# Patient Record
Sex: Male | Born: 1985 | Race: White | Hispanic: No | Marital: Single | Smoking: Former smoker
Health system: Southern US, Community
[De-identification: ages and names within clinical notes are randomized; demographics above are authoritative.]

## PROBLEM LIST (undated history)

## (undated) DIAGNOSIS — J45909 Unspecified asthma, uncomplicated: Secondary | ICD-10-CM

## (undated) DIAGNOSIS — T7840XA Allergy, unspecified, initial encounter: Secondary | ICD-10-CM

## (undated) HISTORY — DX: Allergy, unspecified, initial encounter: T78.40XA

## (undated) HISTORY — DX: Unspecified asthma, uncomplicated: J45.909

---

## 2014-09-13 ENCOUNTER — Ambulatory Visit (INDEPENDENT_AMBULATORY_CARE_PROVIDER_SITE_OTHER): Payer: BC Managed Care – PPO | Admitting: Family Medicine

## 2014-09-13 ENCOUNTER — Ambulatory Visit (INDEPENDENT_AMBULATORY_CARE_PROVIDER_SITE_OTHER): Payer: BC Managed Care – PPO

## 2014-09-13 VITALS — BP 152/92 | HR 82 | Temp 97.9°F | Resp 16 | Ht 73.0 in | Wt 213.0 lb

## 2014-09-13 DIAGNOSIS — S92352A Displaced fracture of fifth metatarsal bone, left foot, initial encounter for closed fracture: Secondary | ICD-10-CM

## 2014-09-13 DIAGNOSIS — S99922A Unspecified injury of left foot, initial encounter: Secondary | ICD-10-CM

## 2014-09-13 MED ORDER — IBUPROFEN 800 MG PO TABS: 800.0000 mg | ORAL_TABLET | Freq: Three times a day (TID) | ORAL | Status: AC | PRN

## 2014-09-13 MED ORDER — HYDROCODONE-ACETAMINOPHEN 5-325 MG PO TABS: 1.0000 | ORAL_TABLET | Freq: Four times a day (QID) | ORAL | Status: DC | PRN

## 2014-09-13 NOTE — Progress Notes (Signed)
Subjective:    Patient ID: Matthew Oneill, male    DOB: 01/30/1986, 28 y.o.   MRN: [id number] Chief Complaint  Patient presents with  . Foot Injury    injured left foot last night   . Foot Pain    HPI  Yesterday was playing soccur and had inversion injury landed on her left lateral foot - heard it crunch. Was able to walk immed after and did finish game but after game pain and swelling progressively worsened. He iced it overnight but this morning sxs have continue to worsen with increased swelling and pain. No medications or prior injuries to foot.  Past Medical History  Diagnosis Date  . Allergy   . Asthma    No current outpatient prescriptions on file prior to visit.   No current facility-administered medications on file prior to visit.   No Known Allergies   Review of Systems  Constitutional: Negative for fever, chills, diaphoresis, activity change and appetite change.  Cardiovascular: Positive for leg swelling.  Musculoskeletal: Positive for arthralgias, gait problem and joint swelling.  Skin: Positive for color change. Negative for rash and wound.  Neurological: Negative for weakness and numbness.  Hematological: Negative for adenopathy. Does not bruise/bleed easily.       Objective:  BP 152/92  Pulse 82  Temp(Src) 97.9 F (36.6 C) (Oral)  Resp 16  Ht 6\' 1"  (1.854 m)  Wt 213 lb (96.616 kg)  BMI 28.11 kg/m2  SpO2 98%  Physical Exam  Constitutional: He is oriented to person, place, and time. He appears well-developed and well-nourished. No distress.  HENT:  Head: Normocephalic and atraumatic.  Eyes: No scleral icterus.  Pulmonary/Chest: Effort normal.  Musculoskeletal:       Left ankle: Normal. He exhibits normal range of motion, no swelling, no ecchymosis and normal pulse.       Left foot: He exhibits decreased range of motion, tenderness, bony tenderness and swelling. He exhibits normal capillary refill, no crepitus, no deformity and no laceration.  Left  foot severely swollen, most on lateral aspect, mild erythema. Metatarsals ttp proximally 2-5th on plantar and dorsal aspect  Neurological: He is alert and oriented to person, place, and time.  Skin: Skin is warm and dry. He is not diaphoretic.  Psychiatric: He has a normal mood and affect. His behavior is normal.      [facility] reading (PRIMARY) by  Dr. Clelia CroftShaw.  Left foot xray: non-displaced styloid avulsion fracture of proximal 5th metatarsal.    EXAM: LEFT FOOT - COMPLETE 3+ VIEW  COMPARISON: None.  FINDINGS: The patient has a nondisplaced fracture of the proximal fifth metatarsal with associated soft tissue swelling. No other bony or joint abnormality is identified.  IMPRESSION: Nondisplaced fracture base of the fifth metatarsal.  Assessment & Plan:  CHARGE FRACTURE CODE SO ALL F/U IS W/ ME RECHECK IN 1 WK. Place in [facility] with ankle at 90 degrees for no longer than 2 wks. Rehceck in 1 wk to see if can transition to firm-sole shoe.  Recheck every 2-4 wks and repeat xray in 6-8 wks to document healing. Advance activity as tolerated. Foot injury, left, initial encounter - Plan: DG Foot Complete Left  Fracture of fifth metatarsal bone of left foot, closed, initial encounter  Meds ordered this encounter  Medications  . HYDROcodone-acetaminophen (NORCO/VICODIN) 5-325 MG per tablet    Sig: Take 1 tablet by mouth every 6 (six) hours as needed for moderate pain.    Dispense:  30 tablet  Refill:  0  . ibuprofen (ADVIL,MOTRIN) 800 MG tablet    Sig: Take 1 tablet (800 mg total) by mouth every 8 (eight) hours as needed.    Dispense:  60 tablet    Refill:  0     Norberto Sorenson, MD MPH

## 2014-09-13 NOTE — Patient Instructions (Signed)
You can bear weight as tolerated. Recheck in 1 wk to see if you can transition to a firm sold shoe (clog or hiking boot - bring with you.)  This will likely heal in 1-2 months, you should probably come in to be rechecked every 2 wks and before you go back to normal shoes we will repeat an xray to ensure it has healed.  Please call ahead to make sure I am in clinic - if I see you in follow-up for this, you should NOT need to pay another co-pay nor wait as long to be seen. Metatarsal Fracture  with Rehab A metatarsal fracture is a break (fracture) of one of the bones of the mid-foot (metatarsal bones). The metatarsal bones are responsible for maintaining the arch of the foot. There are three classifications of metatarsal fractures: dancer's fractures, Jones fractures, and stress fractures. A dancer's fracture is when a piece of bone is pulled off by a ligament or tendon (avulsion fracture) of the outer part of the foot (fifth metatarsal), near the joint with the ankle bones. A Jones fracture occurs in the middle of the fifth metatarsal. These fractures have limited ability to heal. A stress fracture occurs when the bone is slowly injured faster than it can repair itself. SYMPTOMS   Sharp pain, especially with standing or walking.  Tenderness, swelling, and later bruising (contusion) of the foot.  Numbness or paralysis from swelling in the foot, causing pressure on the blood vessels or nerves (uncommon). CAUSES  Fractures occur when a force is placed on the bone that is greater than it can handle. Common causes of injury include:  Direct hit (trauma) to the foot.  Twisting injury to the foot or ankle.  Landing on the foot and ankle in an improper position. RISK INCRESES WITH:  Participation in contact sports, sports that require jumping and landing, or sports in which cleats are worn and sliding occurs.  Previous foot or ankle sprains or dislocations.  Repeated injury to any joint in the  foot.  Poor strength and flexibility. PREVENTION  Warm up and stretch properly before an activity.  Allow for adequate recovery between workouts.  Maintain physical fitness in:  Strength, flexibility, and endurance.  Cardiovascular fitness.  When participating in jumping or contact sports, protect joints with supportive devices, such as wrapped elastic bandages, tape, braces, or high-top athletic shoes.  Wear properly fitted and padded protective equipment. PROGNOSIS If treated properly, metatarsal fractures usually heal well. Jones fractures have a higher risk of the bone failing to heal (nonunion). Sometimes, surgery is needed to heal Jones fractures.  RELATED COMPLICATIONS   Nonunion.  Fracture heals in a poor position (malunion).  Long-term (chronic) pain, stiffness, or swelling of the foot.  Excessive bleeding in the foot or at the dislocation site, causing pressure and injury to nerves and blood vessels (rare).  Unstable or arthritic joint following repeated injury or delayed treatment. TREATMENT  Treatment first involves the use of ice and medicine, to reduce pain and inflammation. If the bone fragments are out of alignment (displaced), then immediate realigning of the bones (reduction) is required. Fractures that cannot be realigned by hand, or where the bones protrude through the skin (open), may require surgery to hold the fracture in place with screws, pins, and plates. After the bones are in proper alignment, the foot and ankle must be restrained for 6 or more weeks. Restraint allows healing to occur. After restraint, it is important to perform strengthening and stretching exercises  to help regain strength and a full range of motion. These exercises may be completed at home or with a therapist. A stiff-soled shoe and arch support (orthotic) may be required when first returning to sports. MEDICATION   If pain medicine is needed, nonsteroidal anti-inflammatory medicines  (NSAIDS), or other minor pain relievers, are often advised.  Do not take pain medicine for 7 days before surgery.  Only take over-the-counter or prescription medicines for pain, fever, or discomfort as directed by your caregiver. COLD THERAPY  Cold treatment (icing) should be applied for 10 to 15 minutes every 2 to 3 hours for inflammations and pain, and immediately after activity that aggravates your symptoms. Use ice packs or ice massage. SEEK MEDICAL CARE IF:  Pain, tenderness, or swelling gets worse, despite treatment.  You experience pain, numbness, or coldness in the foot.  Blue, gray, or dark color appears in the toenails.  You or your child has an oral temperature above 102 F (38.9 C).  You have increased pain, swelling, and redness.  You have drainage of fluids or bleeding in the affected area.  New, unexplained symptoms develop. (Drugs used in treatment may produce side effects.) EXERCISES RANGE OF MOTION (ROM) AND STRETCHING EXERCISES - Metatarsal Fracture (including Jones and Dancer's Fractures) These exercises may help you when beginning to rehabilitate your injury. Your symptoms may resolve with or without further involvement from your physician, physical therapist, or athletic trainer. While completing these exercises, remember:   Restoring tissue flexibility helps normal motion to return to the joints. This allows healthier, less painful movement and activity.  An effective stretch should be held for at least 30 seconds. A stretch should never be painful. You should only feel a gentle lengthening or release in the stretched. RANGE OF MOTION - Dorsi/Plantar Flexion  While sitting with your right / left knee straight, draw the top of your foot upwards, by flexing your ankle. Then reverse the motion, pointing your toes downward.  Hold each position for __________ seconds.  After completing your first set of exercises, repeat this exercise with your knee  bent. Repeat __________ times. Complete this exercise __________ times per day.  RANGE OF MOTION - Ankle Alphabet  Imagine your right / left big toe is a pen.  Keeping your hip and knee still, write out the entire alphabet with your "pen." Make the letters as large as you can, without increasing any discomfort. Repeat __________ times. Complete this exercise __________ times per day.  STRETCH - Gastroc, Standing  Place your hands on a wall.  Extend your right / left leg behind you, keeping the front knee somewhat bent.  Slightly point your toes inward on your back foot.  Keeping your right / left heel on the floor and your knee straight, shift your weight toward the wall, not allowing your back to arch.  You should feel a gentle stretch in the right / left calf. Hold this position for __________ seconds. Repeat __________ times. Complete this stretch __________ times per day. STRETCH - Soleus, Standing   Place your hands on a wall.  Extend your right / left leg behind you, keeping the other knee somewhat bent.  Slightly point your toes inward on your back foot.  Keep your right / left heel on the floor, bend your back knee, and slightly shift your weight over the back leg so that you feel a gentle stretch deep in your back calf.  Hold this position for __________ seconds. Repeat __________ times. Complete  this stretch __________ times per day. STRENGTHENING EXERCISES - Metatarsal Fracture (Including Jones and Dancer's Fractures) These exercises may help you when beginning to rehabilitate your injury. They may resolve your symptoms with or without further involvement from your physician, physical therapist, or athletic trainer. While completing these exercises, remember:   Muscles can gain both the endurance and the strength needed for everyday activities through controlled exercises.  Complete these exercises as instructed by your physician, physical therapist or athletic  trainer. Increase the resistance and repetitions only as guided by your caregiver. STRENGTH - Dorsiflexors  Secure a rubber exercise band or tubing to a fixed object (table, pole) and loop the other end around your right / left foot.  Sit on the floor facing the fixed object. The band should be slightly tense when your foot is relaxed.  Slowly draw your foot back toward you, using your ankle and toes.  Hold this position for __________ seconds. Slowly release the tension in the band and return your foot to the starting position. Repeat __________ times. Complete this exercise __________ times per day.  STRENGTH - Plantar-flexors   Sit with your right / left leg extended. Holding onto both ends of a rubber exercise band or tubing, loop it around the ball of your foot. Keep a slight tension in the band.  Slowly push your toes away from you, pointing them downward.  Hold this position for __________ seconds. Return slowly, controlling the tension in the band. Repeat __________ times. Complete this exercise __________ times per day.  STRENGTH - Plantar-flexors  Stand with your feet shoulder width apart. Steady yourself with a wall or table, using as little support as needed.  Keeping your weight evenly spread over the width of your feet, rise up on your toes.*  Hold this position for __________ seconds. Repeat __________ times. Complete this exercise __________ times per day.  *If this is too easy, shift your weight toward your right / left leg until you feel challenged. Ultimately, you may be asked to do this exercise while standing on your right / left foot only. STRENGTH - Towel Curls  Sit in a chair, on a non-carpeted surface.  Place your foot on a towel, keeping your heel on the floor.  Pull the towel toward your heel only by curling your toes. Keep your heel on the floor.  If instructed by your physician, physical therapist, or athletic trainer, weight may be added at the end of  the towel. Repeat __________ times. Complete this exercise __________ times per day. STRENGTH - Ankle Eversion  Secure one end of a rubber exercise band or tubing to a fixed object (table, pole). Loop the other end around your foot, just before your toes.  Place your fists between your knees. This will focus your strengthening at your ankle.  Drawing the band across your opposite foot, away from the pole, slowly, pull your little toe out and up. Make sure the band is positioned to resist the entire motion.  Hold this position for __________ seconds.  Have your muscles resist the band, as it slowly pulls your foot back to the starting position. Repeat __________ times. Complete this exercise __________ times per day.  STRENGTH - Ankle Inversion  Secure one end of a rubber exercise band or tubing to a fixed object (table, pole). Loop the other end around your foot, just before your toes.  Place your fists between your knees. This will focus your strengthening at your ankle.  Slowly, pull your  big toe up and in, making sure the band is positioned to resist the entire motion.  Hold this position for __________ seconds.  Have your muscles resist the band, as it slowly pulls your foot back to the starting position. Repeat __________ times. Complete this exercises __________ times per day.  Document Released: 11/30/2005 Document Revised: 02/22/2012 Document Reviewed: 03/15/2014 [facility] Patient Information 2015 [place], [place]. This information is not intended to replace advice given to you by your health care provider. Make sure you discuss any questions you have with your health care provider.

## 2014-09-21 ENCOUNTER — Telehealth: Payer: Self-pay | Admitting: Radiology

## 2014-09-21 ENCOUNTER — Ambulatory Visit (INDEPENDENT_AMBULATORY_CARE_PROVIDER_SITE_OTHER): Payer: BC Managed Care – PPO | Admitting: Family Medicine

## 2014-09-21 VITALS — BP 118/70 | HR 60 | Temp 98.3°F | Resp 16 | Ht 73.75 in | Wt 223.0 lb

## 2014-09-21 DIAGNOSIS — Z4789 Encounter for other orthopedic aftercare: Secondary | ICD-10-CM

## 2014-09-21 DIAGNOSIS — IMO0002 Reserved for concepts with insufficient information to code with codable children: Secondary | ICD-10-CM

## 2014-09-21 MED ORDER — HYDROCODONE-ACETAMINOPHEN 5-325 MG PO TABS: 1.0000 | ORAL_TABLET | Freq: Four times a day (QID) | ORAL | Status: AC | PRN

## 2014-09-21 NOTE — Telephone Encounter (Signed)
Left message for pt to advise can work him in for appt  Next week on Friday at 9:15

## 2014-09-28 ENCOUNTER — Ambulatory Visit (INDEPENDENT_AMBULATORY_CARE_PROVIDER_SITE_OTHER): Payer: BC Managed Care – PPO | Admitting: Family Medicine

## 2014-09-28 ENCOUNTER — Encounter: Payer: Self-pay | Admitting: Family Medicine

## 2014-09-28 VITALS — BP 120/70 | HR 86 | Temp 98.3°F | Resp 16 | Ht 73.0 in | Wt 217.2 lb

## 2014-09-28 DIAGNOSIS — Z4789 Encounter for other orthopedic aftercare: Secondary | ICD-10-CM

## 2014-09-28 DIAGNOSIS — IMO0002 Reserved for concepts with insufficient information to code with codable children: Secondary | ICD-10-CM

## 2014-09-28 NOTE — Progress Notes (Deleted)
° °  Subjective:    Patient ID: Matthew Oneill, male    DOB: 03/31/1986, 28 y.o.   MRN: [id number]  This chart was scribed for Sherren MochaEva N Shaw, MD by Annye AsaAnna Dorsett, ED Scribe. This patient was seen in room 27 and the patient's care was started at 10:06 AM.   Chief Complaint  Patient presents with   Follow-up    left foot fracture    HPI  HPI Comments: Matthew Oneill is a 28 y.o. male who presents to the [facility] for a two week follow up on a left previous proximal fifth metatarsal styloid avulsion fracture. He reports that he is doing much better; his pain is only noticeable upon waking in the mornings.   Past Medical History  Diagnosis Date   Allergy    Asthma    Current Outpatient Prescriptions on File Prior to Visit  Medication Sig Dispense Refill   HYDROcodone-acetaminophen (NORCO/VICODIN) 5-325 MG per tablet Take 1 tablet by mouth every 6 (six) hours as needed for moderate pain.  30 tablet  0   ibuprofen (ADVIL,MOTRIN) 800 MG tablet Take 1 tablet (800 mg total) by mouth every 8 (eight) hours as needed.  60 tablet  0   No current facility-administered medications on file prior to visit.   No Known Allergies  Review of Systems  Constitutional: Negative for fever and chills.  Respiratory: Negative for shortness of breath.   Gastrointestinal: Negative for nausea, vomiting, abdominal pain and diarrhea.  Genitourinary: Negative for dysuria, urgency, frequency and hematuria.  Skin: Negative for rash.    BP 120/70   Pulse 86   Temp(Src) 98.3 F (36.8 C) (Oral)   Resp 16   Ht 6\' 1"  (1.854 m)   Wt 217 lb 3.2 oz (98.521 kg)   BMI 28.66 kg/m2   SpO2 96%     Objective:   Physical Exam  Nursing note and vitals reviewed. Constitutional: He is oriented to person, place, and time. He appears well-developed and well-nourished.  HENT:  Head: Normocephalic and atraumatic.  Neck: No tracheal deviation present.  Cardiovascular: Normal rate.   Pulmonary/Chest:  Effort normal.  Musculoskeletal:  Point tenderness over anterior aspect of dorsal proximal fifth metatarsal; none over plantar or lateral aspect  Neurological: He is alert and oriented to person, place, and time.  Skin: Skin is warm and dry.  Psychiatric: He has a normal mood and affect. His behavior is normal.        Assessment & Plan:  Follow-up fracture care for healing fracture Healing well - ok to convirt to firm-soled hiking boot.  Recheck in 2 wks.  I personally performed the services described in this documentation, which was scribed in my presence. The recorded information has been reviewed and considered, and addended by me as needed.  Norberto SorensonEva Shaw, MD MPH

## 2014-10-11 NOTE — Progress Notes (Signed)
   Subjective:    Patient ID: Daphene CalamityRyan Mendel, male    DOB: 01/08/1986, 28 y.o.   MRN: [id number] This chart was scribed for Norberto SorensonEva Shaw, MD by Jolene Provostobert Halas, Medical Scribe. This patient was seen in Room 11 and the patient's care was started a 11:12 AM.  Chief Complaint  Patient presents with  . Follow-up    L foot    HPI  Past Medical History  Diagnosis Date  . Allergy   . Asthma    Current Outpatient Prescriptions on File Prior to Visit  Medication Sig Dispense Refill  . ibuprofen (ADVIL,MOTRIN) 800 MG tablet Take 1 tablet (800 mg total) by mouth every 8 (eight) hours as needed.  60 tablet  0   No current facility-administered medications on file prior to visit.   No Known Allergies  HPI Comments: Daphene CalamityRyan Polyak is a 28 y.o. male who presents to [facility] for a follow up for a left foot injury that happened during a soccer game ten days ago. [facility] Dr. Clelia CroftShaw previously. Pt states pain and swelling are improving. Pt states he is still walking with a boot.  Review of Systems  Constitutional: Negative for fever, chills, diaphoresis, activity change and appetite change.  Musculoskeletal: Positive for arthralgias, gait problem (Left ankle/foot injury.) and joint swelling. Negative for back pain and myalgias.  Skin: Negative for color change, rash and wound.  Neurological: Negative for weakness and numbness.  Hematological: Negative for adenopathy. Does not bruise/bleed easily.       Objective:  BP 118/70  Pulse 60  Temp(Src) 98.3 F (36.8 C) (Oral)  Resp 16  Ht 6' 1.75" (1.873 m)  Wt 223 lb (101.152 kg)  BMI 28.83 kg/m2  SpO2 98%  Physical Exam  Nursing note and vitals reviewed. Constitutional: He is oriented to person, place, and time. He appears well-developed and well-nourished.  HENT:  Head: Normocephalic and atraumatic.  Eyes: Pupils are equal, round, and reactive to light.  Cardiovascular:  Pulses:      Dorsalis pedis pulses are 2+ on the right  side, and 2+ on the left side.  Pulmonary/Chest:  System not reviewed.  Abdominal:  System not reviewed.  Musculoskeletal:       Right shoulder: He exhibits tenderness, swelling and pain. He exhibits normal range of motion.  Pt has mildly reduced dorsiflexion, good plantar flection. Most tenderness focussed over mid-lateral medi tarsals.   Neurological: He is alert and oriented to person, place, and time.  Skin: Skin is warm and dry.  Psychiatric: He has a normal mood and affect. His behavior is normal.          Assessment & Plan:   Follow-up fracture care for healing fracture Healing well. Cont CAM boot, recheck in 1 wk - will hopefully be able to transition to firm-soled shoe or hiking boot at f/u for this proximal 5th metatarsal styloid avulsion fracture 1 wk prev. Meds ordered this encounter  Medications  . HYDROcodone-acetaminophen (NORCO/VICODIN) 5-325 MG per tablet    Sig: Take 1 tablet by mouth every 6 (six) hours as needed for moderate pain.    Dispense:  30 tablet    Refill:  0    I personally performed the services described in this documentation, which was scribed in my presence. The recorded information has been reviewed and considered, and addended by me as needed.  Norberto SorensonEva Shaw, MD MPH

## 2014-10-11 NOTE — Progress Notes (Signed)
   Subjective:  ° ° Patient ID: Matthew Oneill, male    DOB: 07/31/1986, 28 y.o.   MRN: [id number] ° °This chart was scribed for Eva N Shaw, MD by Anna Dorsett, ED Scribe. This patient was seen in room 27 and the patient's care was started at 10:06 AM.  ° °Chief Complaint  °Patient presents with  °• Follow-up  °  left foot fracture  ° ° °HPI ° °HPI Comments: °Matthew Oneill is a 28 y.o. male who presents to the [facility] for a two week follow up on a left previous proximal fifth metatarsal styloid avulsion fracture. He reports that he is doing much better; his pain is only noticeable upon waking in the mornings.  ° °Past Medical History  °Diagnosis Date  °• Allergy   °• Asthma   ° °Current Outpatient Prescriptions on File Prior to Visit  °Medication Sig Dispense Refill  °• HYDROcodone-acetaminophen (NORCO/VICODIN) 5-325 MG per tablet Take 1 tablet by mouth every 6 (six) hours as needed for moderate pain.  30 tablet  0  °• ibuprofen (ADVIL,MOTRIN) 800 MG tablet Take 1 tablet (800 mg total) by mouth every 8 (eight) hours as needed.  60 tablet  0  ° °No current facility-administered medications on file prior to visit.  ° °No Known Allergies ° °Review of Systems  °Constitutional: Negative for fever and chills.  °Respiratory: Negative for shortness of breath.   °Gastrointestinal: Negative for nausea, vomiting, abdominal pain and diarrhea.  °Genitourinary: Negative for dysuria, urgency, frequency and hematuria.  °Skin: Negative for rash.  ° ° °BP 120/70   Pulse 86   Temp(Src) 98.3 °F (36.8 °C) (Oral)   Resp 16   Ht 6' 1" (1.854 m)   Wt 217 lb 3.2 oz (98.521 kg)   BMI 28.66 kg/m2   SpO2 96% ° °   °Objective:  ° Physical Exam  °Nursing note and vitals reviewed. °Constitutional: He is oriented to person, place, and time. He appears well-developed and well-nourished.  °HENT:  °Head: Normocephalic and atraumatic.  °Neck: No tracheal deviation present.  °Cardiovascular: Normal rate.   °Pulmonary/Chest:  Effort normal.  °Musculoskeletal:  °Point tenderness over anterior aspect of dorsal proximal fifth metatarsal; none over plantar or lateral aspect  °Neurological: He is alert and oriented to person, place, and time.  °Skin: Skin is warm and dry.  °Psychiatric: He has a normal mood and affect. His behavior is normal.  ° ° ° °   °Assessment & Plan:  °Follow-up fracture care for healing fracture °Healing well - ok to convirt to firm-soled hiking boot.  Recheck in 2 wks. ° °I personally performed the services described in this documentation, which was scribed in my presence. The recorded information has been reviewed and considered, and addended by me as needed.  °Eva Shaw, MD MPH ° ° ° °

## 2014-10-12 ENCOUNTER — Encounter: Payer: BC Managed Care – PPO | Admitting: Family Medicine

## 2014-10-12 NOTE — Progress Notes (Signed)
This encounter was created in error - please disregard.

## 2016-05-08 IMAGING — CR DG FOOT COMPLETE 3+V*L*
3 series · 3 of 3 positions shown · non-contrast
Comparison: None.

CLINICAL DATA: Left foot pain after an injury playing soccer.

EXAM:
LEFT FOOT - COMPLETE 3+ VIEW

[AP]
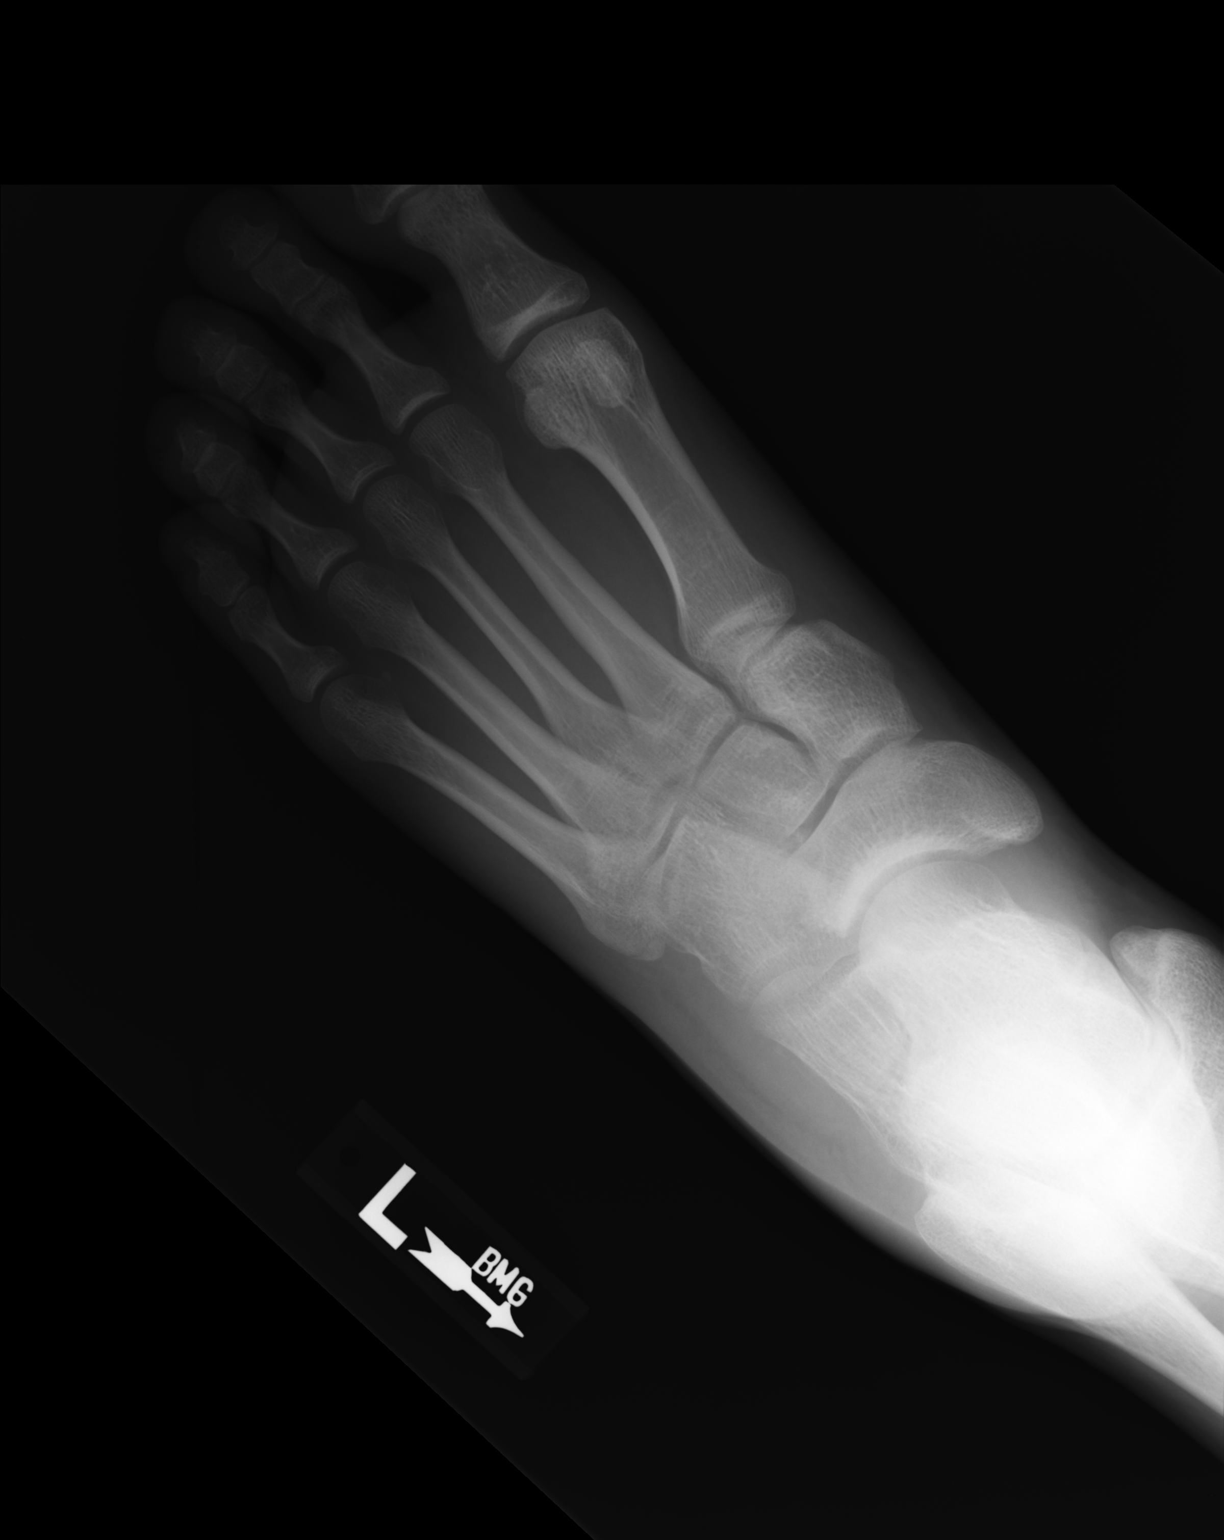

[ap obl int rot]
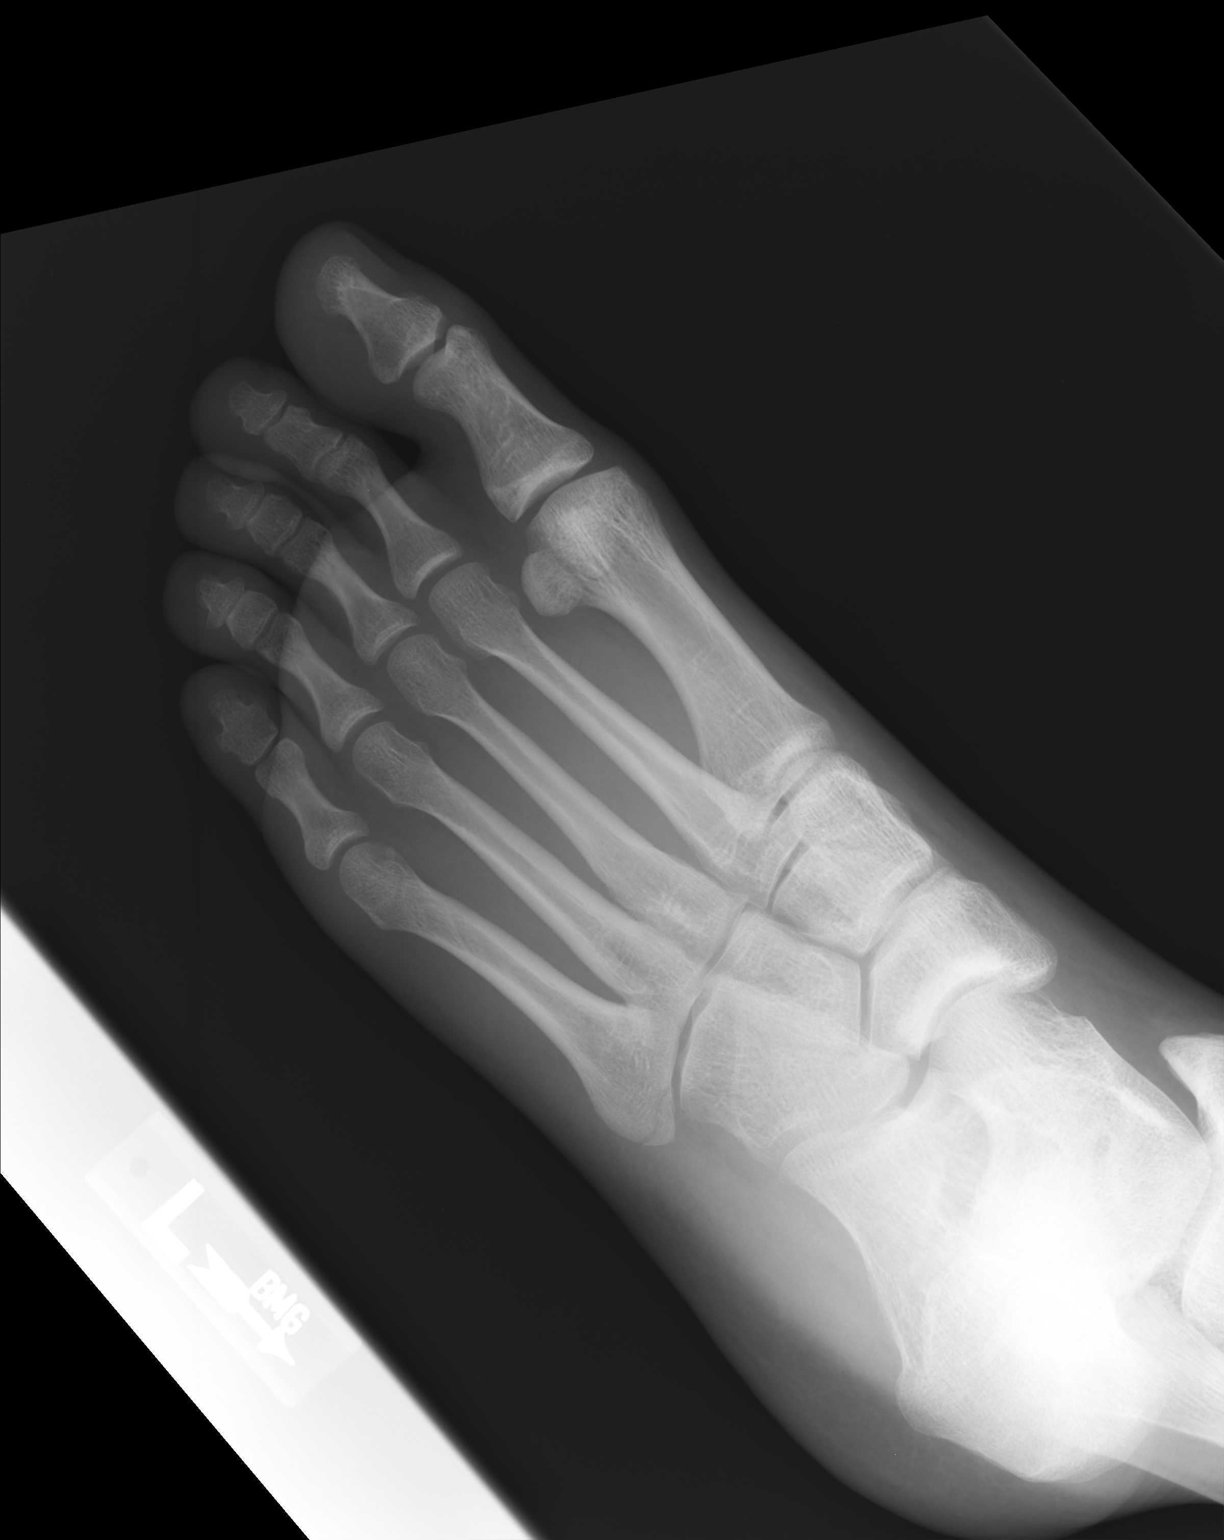

[lateral]
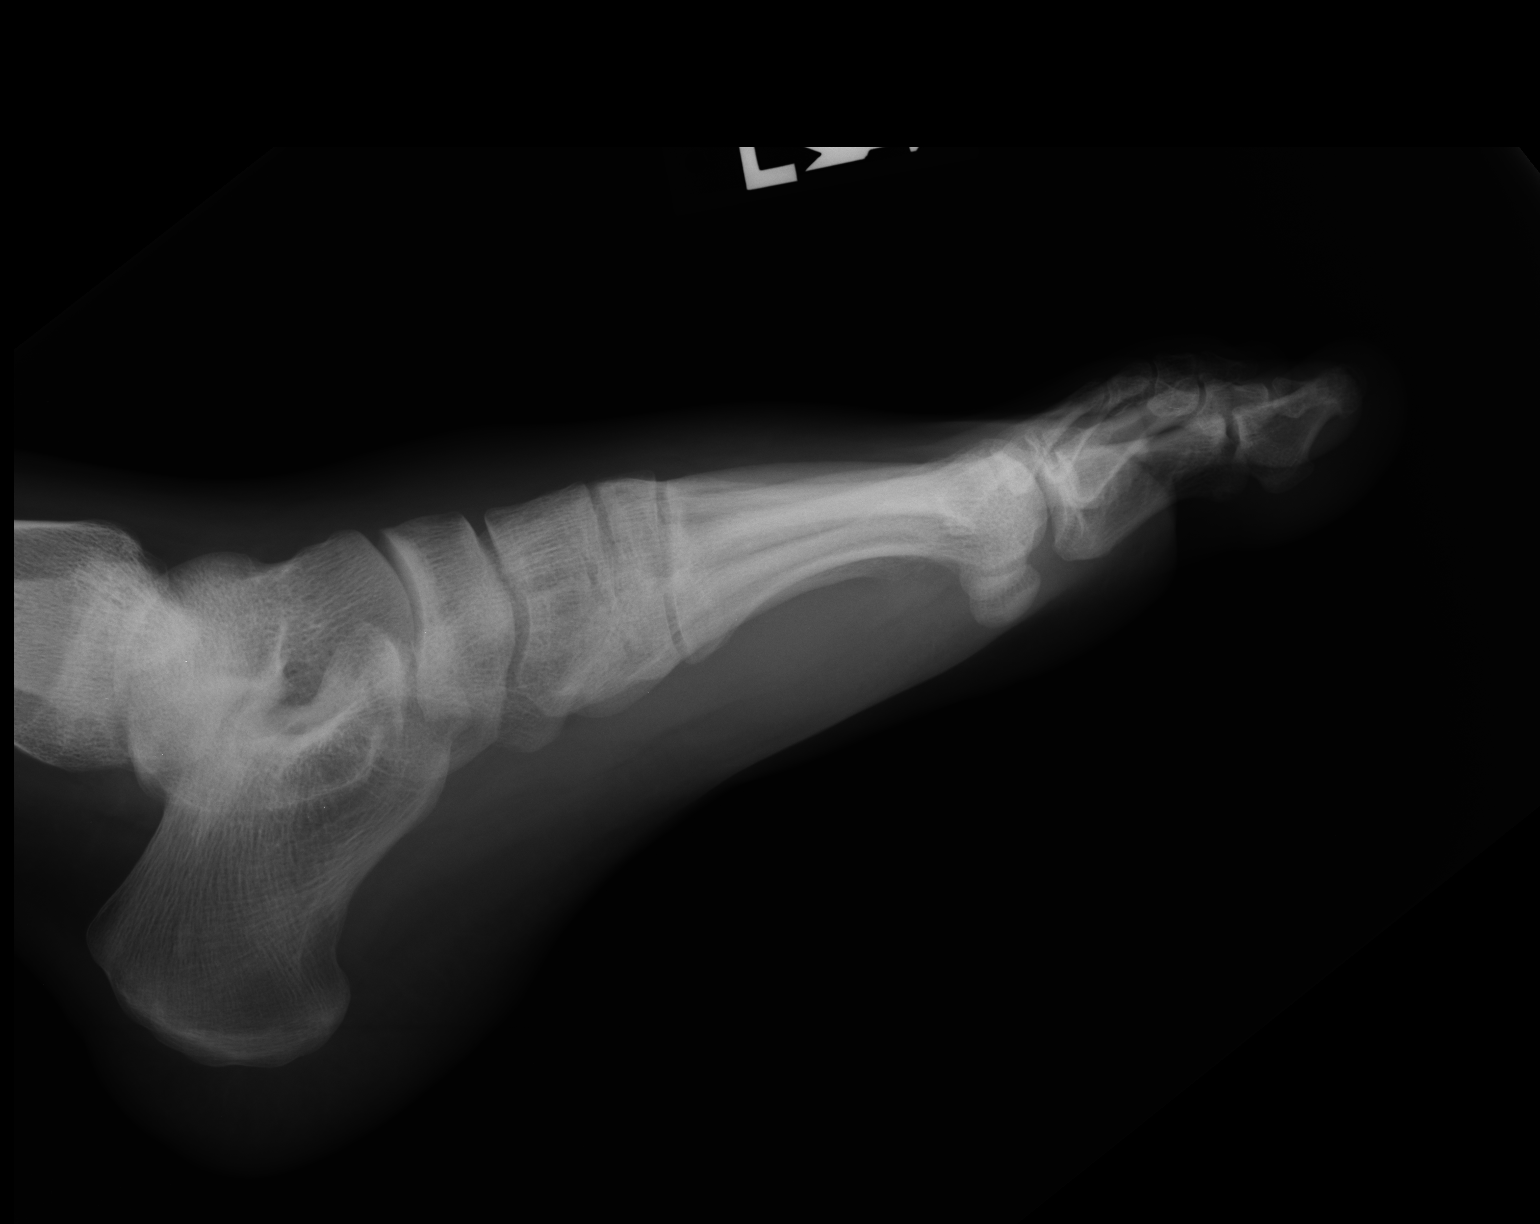

[3 of 3 positions shown; findings below may reference images not displayed]

FINDINGS: The patient has a nondisplaced fracture of the proximal fifth
metatarsal with associated soft tissue swelling. No other bony or
joint abnormality is identified.
IMPRESSION: Nondisplaced fracture base of the fifth metatarsal.
